# Patient Record
Sex: Female | Born: 2000 | Race: Black or African American | Hispanic: No | Marital: Single
Health system: Midwestern US, Community
[De-identification: ages and names within clinical notes are randomized; demographics above are authoritative.]

## PROBLEM LIST (undated history)

## (undated) HISTORY — PX: NO PAST SURGERIES: SHX2092

---

## 2020-05-02 ENCOUNTER — Emergency Department: Admit: 2020-05-02 | Payer: MEDICAID

## 2020-05-02 ENCOUNTER — Inpatient Hospital Stay
Admit: 2020-05-02 | Discharge: 2020-05-03 | Disposition: A | Discharge status: Home / Self Care | Payer: MEDICAID | Attending: Emergency Medicine

## 2020-05-02 DIAGNOSIS — R519 Headache, unspecified: Secondary | ICD-10-CM

## 2020-05-02 MED ORDER — KETOROLAC TROMETHAMINE 30 MG/ML INJECTION
30 mg/mL (1 mL) | Freq: Once | INTRAMUSCULAR | Status: DC
Start: 2020-05-02 — End: 2020-05-03

## 2020-05-02 MED ORDER — CYCLOBENZAPRINE 10 MG TAB
10 mg | ORAL | Status: DC
Start: 2020-05-02 — End: 2020-05-03
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
## 2020-05-02 NOTE — ED Notes (Signed)
patient in radiology at this time
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
## 2020-05-02 NOTE — ED Notes (Signed)
Discharge instructions explained to patient at this time; patient verbalized understanding of diagnosis, reason for treatments received, and the importance of the recommended follow up visit; denies any needs at the present time

## 2020-05-03 LAB — CBC WITH AUTOMATED DIFF
ABS. BASOPHILS: 0.2 10*3/uL (ref 0.0–0.2)
ABS. EOSINOPHILS: 0.3 10*3/uL (ref 0.0–0.7)
ABS. LYMPHOCYTES: 2.5 10*3/uL (ref 1.0–4.8)
ABS. MONOCYTES: 0.6 10*3/uL (ref 0.2–2.4)
ABS. NEUTROPHILS: 5.3 10*3/uL (ref 1.8–7.7)
ABSOLUTE NRBC: 0.01 10*3/uL
BASOPHILS: 2 % (ref 0.0–2.5)
EOSINOPHILS: 4 % — ABNORMAL HIGH (ref 0.9–2.9)
HCT: 39.8 % (ref 36–46)
HGB: 13.5 g/dL (ref 13.5–17.5)
LYMPHOCYTES: 28 % (ref 20.5–51.1)
MCH: 31.6 PG (ref 31–34)
MCHC: 34 g/dL (ref 31.0–36.0)
MCV: 93.1 FL (ref 80–100)
MONOCYTES: 7 % (ref 1.7–9.3)
MPV: 8.8 FL (ref 6.5–11.5)
NEUTROPHILS: 59 % (ref 42–75)
NRBC: 0.1 PER 100 WBC
PLATELET: 224 10*3/uL (ref 150–400)
RBC: 4.28 M/uL — ABNORMAL LOW (ref 4.50–5.90)
RDW: 14 % (ref 11.5–14.5)
WBC: 8.9 10*3/uL (ref 4.4–11.3)

## 2020-05-03 LAB — METABOLIC PANEL, COMPREHENSIVE
A-G Ratio: 0.9 — ABNORMAL LOW (ref 1.1–2.2)
ALT (SGPT): 32 U/L (ref 12–78)
AST (SGOT): 51 U/L — ABNORMAL HIGH (ref 15–37)
Albumin: 3.5 g/dL (ref 3.5–5.0)
Alk. phosphatase: 58 U/L (ref 45–117)
Anion gap: 12 mmol/L (ref 5–15)
BUN/Creatinine ratio: 11 — ABNORMAL LOW (ref 12–20)
BUN: 10 mg/dL (ref 6–20)
Bilirubin, total: 0.2 mg/dL (ref 0.2–1.0)
CO2: 26 mmol/L (ref 21–32)
Calcium: 9.2 mg/dL (ref 8.5–10.1)
Chloride: 106 mmol/L (ref 97–108)
Creatinine: 0.92 mg/dL (ref 0.55–1.02)
GFR est AA: >60 mL/min/{1.73_m2} (ref >60)
GFR est non-AA: >60 mL/min/{1.73_m2} (ref >60)
Globulin: 3.9 g/dL (ref 2.0–4.0)
Glucose: 82 mg/dL (ref 65–100)
Potassium: 3.7 mmol/L (ref 3.5–5.1)
Protein, total: 7.4 g/dL (ref 6.4–8.2)
Sodium: 144 mmol/L (ref 136–145)

## 2020-05-03 LAB — URINALYSIS W/ RFLX MICROSCOPIC
Bilirubin, Urine: NEGATIVE
Bilirubin: NEGATIVE
Blood, Urine: NEGATIVE
Blood: NEGATIVE
Glucose, Ur: NEGATIVE mg/dL
Glucose: NEGATIVE mg/dL
Ketone: NEGATIVE mg/dL
Ketones, Urine: NEGATIVE mg/dL
Nitrite, Urine: NEGATIVE
Nitrites: NEGATIVE
Protein, UA: NEGATIVE mg/dL
Protein: NEGATIVE mg/dL
Specific Gravity, UA: 1.02 (ref 1.003–1.030)
Specific gravity: 1.02 (ref 1.003–1.030)
Urobilinogen, UA, POCT: 0.2 EU/dL (ref 0.2–1.0)
Urobilinogen: 0.2 EU/dL (ref 0.2–1.0)
pH (UA): 6 (ref 5.0–8.0)
pH, UA: 6 (ref 5.0–8.0)

## 2020-05-03 LAB — URINE MICROSCOPIC

## 2020-05-03 LAB — PROTHROMBIN TIME + INR
INR: 1 (ref 0.9–1.1)
Prothrombin time: 9.4 s (ref 9.0–11.1)

## 2020-05-03 LAB — HCG URINE, QL
HCG urine, QL: NEGATIVE
Pregnancy Test(Urn): NEGATIVE

## 2020-05-03 LAB — COMPREHENSIVE METABOLIC PANEL
ALT: 32 U/L (ref 12–78)
AST: 51 U/L — ABNORMAL HIGH (ref 15–37)
Albumin/Globulin Ratio: 0.9 — ABNORMAL LOW (ref 1.1–2.2)
Albumin: 3.5 g/dL (ref 3.5–5.0)
Alkaline Phosphatase: 58 U/L (ref 45–117)
Anion Gap: 12 mmol/L (ref 5–15)
BUN: 10 mg/dL (ref 6–20)
Bun/Cre Ratio: 11 — ABNORMAL LOW (ref 12–20)
CO2: 26 mmol/L (ref 21–32)
Calcium: 9.2 mg/dL (ref 8.5–10.1)
Chloride: 106 mmol/L (ref 97–108)
Creatinine: 0.92 mg/dL (ref 0.55–1.02)
EGFR IF NonAfrican American: >60 mL/min/{1.73_m2} (ref >60)
GFR African American: >60 mL/min/{1.73_m2} (ref >60)
Globulin: 3.9 g/dL (ref 2.0–4.0)
Glucose: 82 mg/dL (ref 65–100)
Potassium: 3.7 mmol/L (ref 3.5–5.1)
Sodium: 144 mmol/L (ref 136–145)
Total Bilirubin: 0.2 mg/dL (ref 0.2–1.0)
Total Protein: 7.4 g/dL (ref 6.4–8.2)

## 2020-05-03 LAB — CBC WITH AUTO DIFFERENTIAL
Basophils %: 2 % (ref 0.0–2.5)
Basophils Absolute: 0.2 10*3/uL (ref 0.0–0.2)
Eosinophils %: 4 % — ABNORMAL HIGH (ref 0.9–2.9)
Eosinophils Absolute: 0.3 10*3/uL (ref 0.0–0.7)
Hematocrit: 39.8 % (ref 36–46)
Hemoglobin: 13.5 g/dL (ref 13.5–17.5)
Lymphocytes %: 28 % (ref 20.5–51.1)
Lymphocytes Absolute: 2.5 10*3/uL (ref 1.0–4.8)
MCH: 31.6 PG (ref 31–34)
MCHC: 34 g/dL (ref 31.0–36.0)
MCV: 93.1 FL (ref 80–100)
MPV: 8.8 FL (ref 6.5–11.5)
Monocytes %: 7 % (ref 1.7–9.3)
Monocytes Absolute: 0.6 10*3/uL (ref 0.2–2.4)
NRBC Absolute: 0.01 10*3/uL
Neutrophils %: 59 % (ref 42–75)
Neutrophils Absolute: 5.3 10*3/uL (ref 1.8–7.7)
Nucleated RBCs: 0.1 PER 100 WBC
Platelets: 224 10*3/uL (ref 150–400)
RBC: 4.28 M/uL — ABNORMAL LOW (ref 4.50–5.90)
RDW: 14 % (ref 11.5–14.5)
WBC: 8.9 10*3/uL (ref 4.4–11.3)

## 2020-05-03 LAB — PROTIME-INR
INR: 1 (ref 0.9–1.1)
Protime: 9.4 s (ref 9.0–11.1)

## 2020-05-03 MED ORDER — IOPAMIDOL 76 % IV SOLN
76 % | Freq: Once | INTRAVENOUS | Status: AC
Start: 2020-05-03 — End: 2020-05-02
  Administered 2020-05-03: 02:00:00 via INTRAVENOUS

## 2020-05-03 MED FILL — CYCLOBENZAPRINE 10 MG TAB: 10 mg | ORAL | Qty: 1

## 2020-05-03 MED FILL — ISOVUE-370  76 % INTRAVENOUS SOLUTION: 370 mg iodine /mL (76 %) | INTRAVENOUS | Qty: 100

## 2020-05-03 MED FILL — KETOROLAC TROMETHAMINE 30 MG/ML INJECTION: 30 mg/mL (1 mL) | INTRAMUSCULAR | Qty: 1

## 2020-08-07 ENCOUNTER — Encounter: Payer: Self-pay | Admitting: Neurology

## 2020-08-08 ENCOUNTER — Other Ambulatory Visit: Payer: Self-pay

## 2020-08-08 ENCOUNTER — Ambulatory Visit (HOSPITAL_COMMUNITY)
Admission: EM | Admit: 2020-08-08 | Discharge: 2020-08-08 | Disposition: A | Discharge status: Home / Self Care | Payer: Medicaid Other | Attending: Emergency Medicine | Admitting: Emergency Medicine

## 2020-08-08 ENCOUNTER — Encounter (HOSPITAL_COMMUNITY): Payer: Self-pay

## 2020-08-08 DIAGNOSIS — S39012D Strain of muscle, fascia and tendon of lower back, subsequent encounter: Secondary | ICD-10-CM | POA: Diagnosis not present

## 2020-08-08 MED ORDER — METHOCARBAMOL 500 MG PO TABS: 500.0000 mg | ORAL_TABLET | Freq: Two times a day (BID) | ORAL | 0 refills | Status: DC

## 2020-08-08 MED ORDER — NAPROXEN 500 MG PO TABS: 500.0000 mg | ORAL_TABLET | Freq: Two times a day (BID) | ORAL | 0 refills | Status: DC
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
## 2020-08-08 NOTE — Discharge Instructions (Addendum)
If symptoms not any better you will need to follow up with an orthopedic to have further treatment an x rays Your symptoms will become worse in the next few days before getting better  Take medications as needed
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
## 2020-08-26 ENCOUNTER — Other Ambulatory Visit: Payer: Self-pay

## 2020-08-26 ENCOUNTER — Ambulatory Visit (INDEPENDENT_AMBULATORY_CARE_PROVIDER_SITE_OTHER): Payer: Medicaid Other | Admitting: Neurology

## 2020-08-26 ENCOUNTER — Encounter: Payer: Self-pay | Admitting: Neurology

## 2020-08-26 VITALS — BP 120/74 | HR 100 | Ht 66.0 in | Wt 162.4 lb

## 2020-08-26 DIAGNOSIS — R414 Neurologic neglect syndrome: Secondary | ICD-10-CM

## 2020-08-26 DIAGNOSIS — F0781 Postconcussional syndrome: Secondary | ICD-10-CM

## 2020-08-26 DIAGNOSIS — G44309 Post-traumatic headache, unspecified, not intractable: Secondary | ICD-10-CM | POA: Diagnosis not present

## 2020-08-26 NOTE — Patient Instructions (Addendum)
1.  Will get MRI of brain without contrast. We have sent a referral to [facility] for your MRI and they will call you directly to schedule your appointment. They are located at [address] [facility]. If you need to contact them directly please call [phone].  2.  Will refer you to Dr. Wynell Balloon at [facility] - No driving until further notice.  After I receive his note, I will let you know if it is okay to drive.  3.  To help improve COGNITIVE function: Using fish oil/omega 3 that is 1000 mg (or roughly 600 mg EPA/DHA), starting as soon as possible after concussion, take: 3 tabs THREE TIMES a day  for the first 3 days, then (you will smell a little, sory) 3 tabs TWICE DAILY  for the next 3 days, then 3 tabs ONCE DAILY  for the next 10 days    4.  To help reduce HEADACHES: Coenzyme Q10 300mg  ONCE DAILY Riboflavin/Vitamin B2 400mg  ONCE DAILY Magnesium oxide 400mg  ONCE DAILY May stop after headaches are resolved.                                                                                               5.  To help with INSOMNIA: Melatonin 3-5mg  AT BEDTIME     6.  Other medicines to help decrease inflammation Alpha Lipoic Acid 100mg  TWICE DAILY Turmeric 500mg  twice daily Vitamin D 4000 IU daily for 2 weeks then 2000 IU daily thereafter.  7.  Follow up in 2 months.

## 2020-09-16 ENCOUNTER — Ambulatory Visit
Admission: RE | Admit: 2020-09-16 | Discharge: 2020-09-16 | Disposition: A | Discharge status: Home / Self Care | Payer: Medicaid Other | Source: Ambulatory Visit | Attending: Neurology | Admitting: Neurology

## 2020-09-16 DIAGNOSIS — F0781 Postconcussional syndrome: Secondary | ICD-10-CM

## 2020-10-07 ENCOUNTER — Ambulatory Visit: Payer: Medicaid Other | Admitting: Neurology

## 2020-11-06 ENCOUNTER — Ambulatory Visit: Payer: 59 | Admitting: Neurology
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
## 2020-11-19 ENCOUNTER — Telehealth (INDEPENDENT_AMBULATORY_CARE_PROVIDER_SITE_OTHER): Payer: 59 | Admitting: Neurology

## 2020-11-19 ENCOUNTER — Encounter: Payer: Self-pay | Admitting: Neurology

## 2020-11-19 ENCOUNTER — Other Ambulatory Visit: Payer: Self-pay

## 2020-11-19 VITALS — Ht 67.0 in | Wt 171.0 lb

## 2020-11-19 DIAGNOSIS — F0781 Postconcussional syndrome: Secondary | ICD-10-CM | POA: Diagnosis not present

## 2020-11-19 DIAGNOSIS — G44309 Post-traumatic headache, unspecified, not intractable: Secondary | ICD-10-CM

## 2020-11-20 ENCOUNTER — Ambulatory Visit: Payer: 59 | Admitting: Neurology
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
## 2021-05-14 ENCOUNTER — Ambulatory Visit: Payer: 59 | Admitting: Neurology
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
## 2021-05-16 ENCOUNTER — Encounter: Payer: Self-pay | Admitting: Neurology

## 2021-05-16 ENCOUNTER — Other Ambulatory Visit: Payer: Self-pay

## 2021-05-16 ENCOUNTER — Ambulatory Visit (INDEPENDENT_AMBULATORY_CARE_PROVIDER_SITE_OTHER): Payer: 59 | Admitting: Neurology

## 2021-05-16 VITALS — BP 98/56 | HR 85 | Ht 67.0 in | Wt 169.6 lb

## 2021-05-16 DIAGNOSIS — F0781 Postconcussional syndrome: Secondary | ICD-10-CM

## 2021-05-16 DIAGNOSIS — H538 Other visual disturbances: Secondary | ICD-10-CM

## 2021-05-16 NOTE — Patient Instructions (Signed)
Everything looks good Follow up as needed Have a repeat eye exam

## 2021-06-30 ENCOUNTER — Ambulatory Visit: Payer: Medicaid Other | Admitting: Neurology
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
## 2022-05-24 ENCOUNTER — Ambulatory Visit (INDEPENDENT_AMBULATORY_CARE_PROVIDER_SITE_OTHER): Payer: 59

## 2022-05-24 ENCOUNTER — Encounter: Payer: Self-pay | Admitting: *Deleted

## 2022-05-24 ENCOUNTER — Ambulatory Visit
Admission: EM | Admit: 2022-05-24 | Discharge: 2022-05-24 | Disposition: A | Discharge status: Home / Self Care | Payer: 59 | Attending: Urgent Care | Admitting: Urgent Care

## 2022-05-24 DIAGNOSIS — S80812A Abrasion, left lower leg, initial encounter: Secondary | ICD-10-CM

## 2022-05-24 DIAGNOSIS — T07XXXA Unspecified multiple injuries, initial encounter: Secondary | ICD-10-CM

## 2022-05-24 DIAGNOSIS — S93401A Sprain of unspecified ligament of right ankle, initial encounter: Secondary | ICD-10-CM

## 2022-05-24 DIAGNOSIS — M25571 Pain in right ankle and joints of right foot: Secondary | ICD-10-CM

## 2022-05-24 DIAGNOSIS — R9389 Abnormal findings on diagnostic imaging of other specified body structures: Secondary | ICD-10-CM | POA: Diagnosis not present

## 2022-05-24 MED ORDER — BACITRACIN 500 UNIT/GM EX OINT
1.0000 | TOPICAL_OINTMENT | Freq: Once | CUTANEOUS | Status: AC
  Administered 2022-05-24: 1 via TOPICAL

## 2022-05-24 MED ORDER — BACITRACIN ZINC 500 UNIT/GM EX OINT: 1.0000 | TOPICAL_OINTMENT | Freq: Two times a day (BID) | CUTANEOUS | 0 refills | Status: AC

## 2022-05-24 NOTE — ED Triage Notes (Signed)
Reports tripping and falling onto cement @ approx 0200 this AM, rolling right ankle. C/O painful weightbearing of RLE and "popping" frequently. Has been applying compression wrap, ice, and elevating RLE. Abrasion to left knee, left hand and left posterior elbow.

## 2022-05-24 NOTE — Discharge Instructions (Signed)
Change your dressing 2-3 times daily. Every time you change your dressing, clean the wound gently with warm water and Dial antibacterial soap. Pat the wound dry, let it breathe for roughly an hour before covering it back up. When you reapply a dressing, use non-stick/non-adherent gauze. Apply Bacitracin ointment to the wound. After about 7 days, the wound should scab over nicely and then you don't have to continue doing dressings.
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
